# Patient Record
Sex: Female | Born: 1995 | Race: Black or African American | Hispanic: No | Marital: Single | Smoking: Never smoker
Health system: Southern US, Community
[De-identification: ages and names within clinical notes are randomized; demographics above are authoritative.]

---

## 2020-08-23 ENCOUNTER — Encounter (HOSPITAL_BASED_OUTPATIENT_CLINIC_OR_DEPARTMENT_OTHER): Payer: Self-pay

## 2020-08-23 ENCOUNTER — Other Ambulatory Visit: Payer: Self-pay

## 2020-08-23 ENCOUNTER — Emergency Department (HOSPITAL_BASED_OUTPATIENT_CLINIC_OR_DEPARTMENT_OTHER): Payer: BC Managed Care – PPO

## 2020-08-23 ENCOUNTER — Emergency Department (HOSPITAL_BASED_OUTPATIENT_CLINIC_OR_DEPARTMENT_OTHER)
Admission: EM | Admit: 2020-08-23 | Discharge: 2020-08-23 | Disposition: A | Discharge status: Home / Self Care | Payer: BC Managed Care – PPO | Attending: Emergency Medicine | Admitting: Emergency Medicine

## 2020-08-23 DIAGNOSIS — Y92410 Unspecified street and highway as the place of occurrence of the external cause: Secondary | ICD-10-CM | POA: Diagnosis not present

## 2020-08-23 DIAGNOSIS — R519 Headache, unspecified: Secondary | ICD-10-CM | POA: Diagnosis not present

## 2020-08-23 DIAGNOSIS — M542 Cervicalgia: Secondary | ICD-10-CM | POA: Insufficient documentation

## 2020-08-23 DIAGNOSIS — R110 Nausea: Secondary | ICD-10-CM | POA: Diagnosis not present

## 2020-08-23 LAB — PREGNANCY, URINE: Preg Test, Ur: NEGATIVE

## 2020-08-23 MED ORDER — CYCLOBENZAPRINE HCL 10 MG PO TABS: 10.0000 mg | ORAL_TABLET | Freq: Two times a day (BID) | ORAL | 0 refills | Status: AC | PRN

## 2020-08-23 MED ORDER — ACETAMINOPHEN 325 MG PO TABS
650.0000 mg | ORAL_TABLET | Freq: Once | ORAL | Status: AC
  Administered 2020-08-23: 650 mg via ORAL
  Filled 2020-08-23: qty 2

## 2020-08-23 NOTE — ED Notes (Signed)
Patient transported to CT 
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
